# Patient Record
Sex: Female | Born: 1965 | Race: White | Hispanic: No | Marital: Married | Smoking: Never smoker
Health system: Southern US, Community
[De-identification: ages and names within clinical notes are randomized; demographics above are authoritative.]

---

## 1999-03-22 ENCOUNTER — Other Ambulatory Visit: Admission: RE | Admit: 1999-03-22 | Discharge: 1999-03-22 | Payer: Self-pay | Admitting: Obstetrics and Gynecology

## 1999-10-06 ENCOUNTER — Inpatient Hospital Stay (HOSPITAL_COMMUNITY): Admission: AD | Admit: 1999-10-06 | Discharge: 1999-10-09 | Payer: Self-pay | Admitting: *Deleted

## 1999-12-02 ENCOUNTER — Other Ambulatory Visit: Admission: RE | Admit: 1999-12-02 | Discharge: 1999-12-02 | Payer: Self-pay | Admitting: Obstetrics and Gynecology

## 2001-03-29 ENCOUNTER — Other Ambulatory Visit: Admission: RE | Admit: 2001-03-29 | Discharge: 2001-03-29 | Payer: Self-pay | Admitting: Obstetrics and Gynecology

## 2004-07-01 ENCOUNTER — Other Ambulatory Visit: Admission: RE | Admit: 2004-07-01 | Discharge: 2004-07-01 | Payer: Self-pay | Admitting: Obstetrics and Gynecology

## 2004-09-29 ENCOUNTER — Inpatient Hospital Stay (HOSPITAL_COMMUNITY): Admission: AD | Admit: 2004-09-29 | Discharge: 2004-11-11 | Payer: Self-pay | Admitting: Obstetrics and Gynecology

## 2004-09-30 ENCOUNTER — Ambulatory Visit: Payer: Self-pay | Admitting: Neonatology

## 2004-11-09 ENCOUNTER — Encounter (INDEPENDENT_AMBULATORY_CARE_PROVIDER_SITE_OTHER): Payer: Self-pay | Admitting: *Deleted

## 2004-12-16 ENCOUNTER — Other Ambulatory Visit: Admission: RE | Admit: 2004-12-16 | Discharge: 2004-12-16 | Payer: Self-pay | Admitting: Obstetrics and Gynecology

## 2005-10-01 IMAGING — US US OB LIMITED
1 series · 13 of 28 positions shown · non-contrast
Comparison: none

CLINICAL DATA: Monitoring.  Patient?s fetal kidneys have not been identified and there is suspected renal agenesis present.

[Series 1: us ob limited · 0.31mm/px · 13 of 34 slices shown]
[im 2/34]
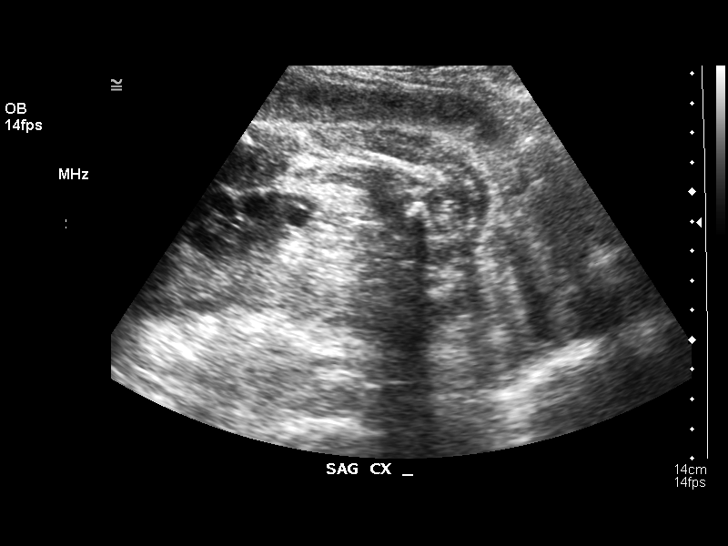
[im 4/34]
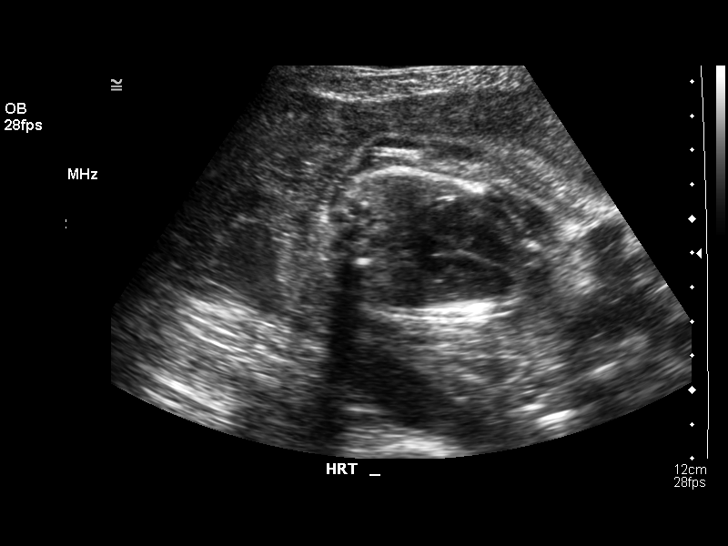
[im 7/34]
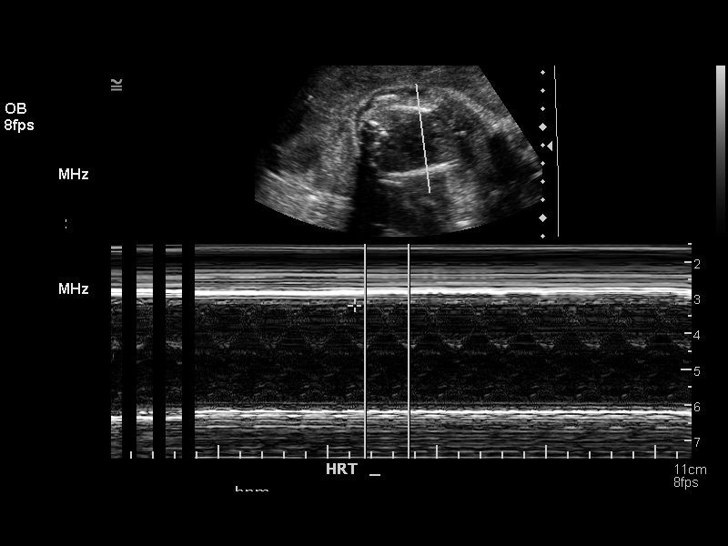
[im 9/34]
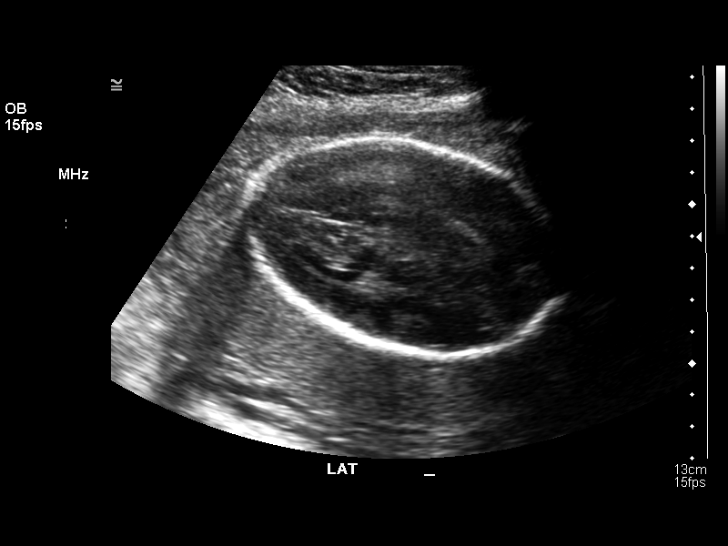
[im 12/34]
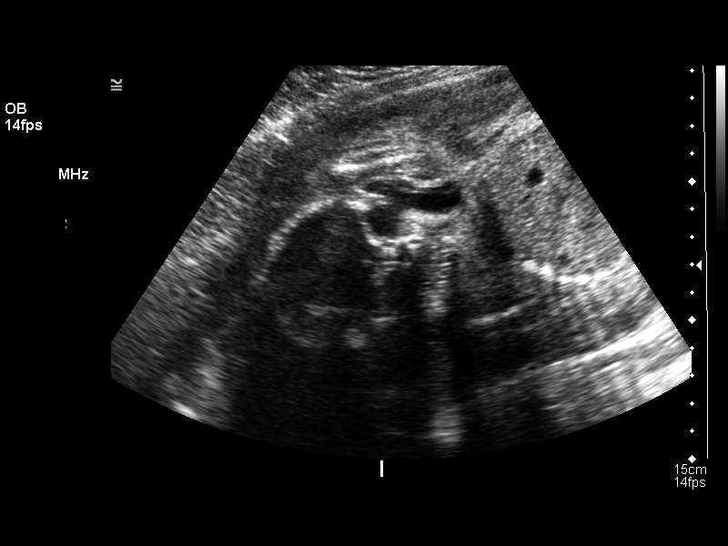
[im 14/34]
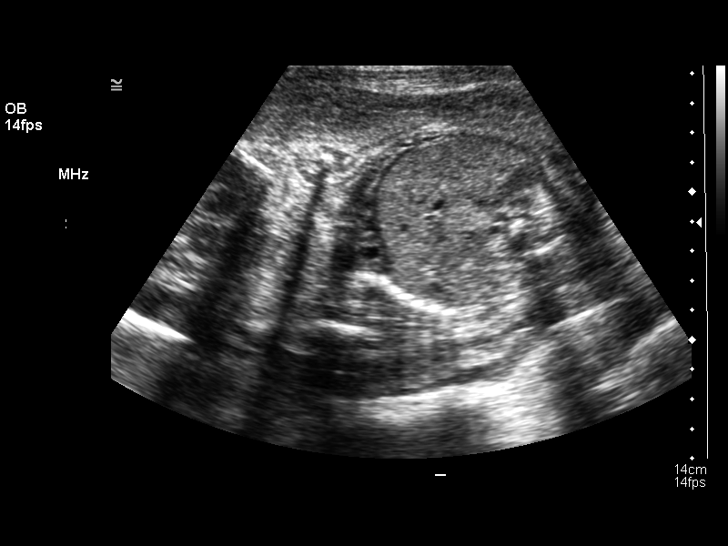
[im 18/34]
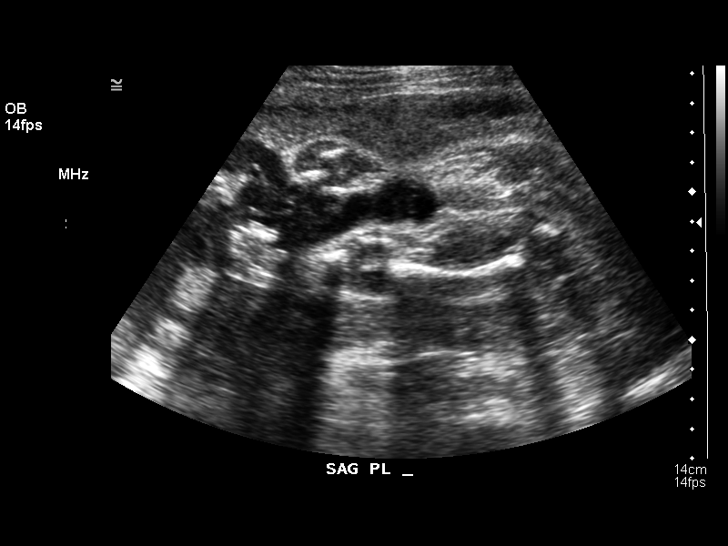
[im 20/34]
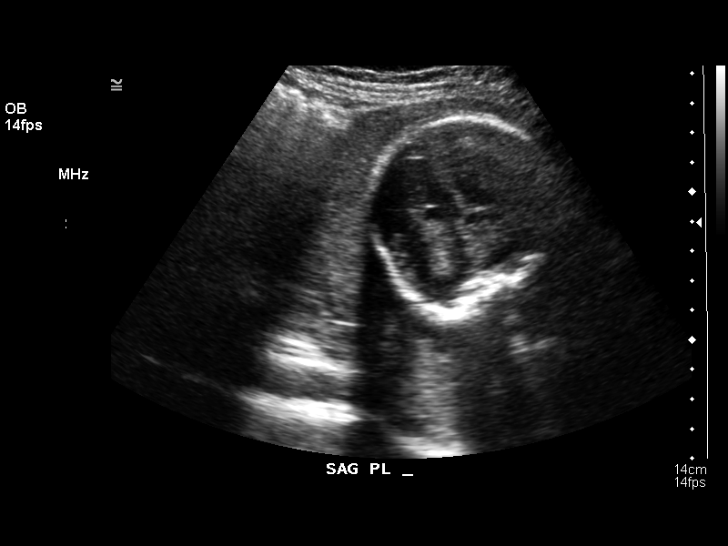
[im 23/34]
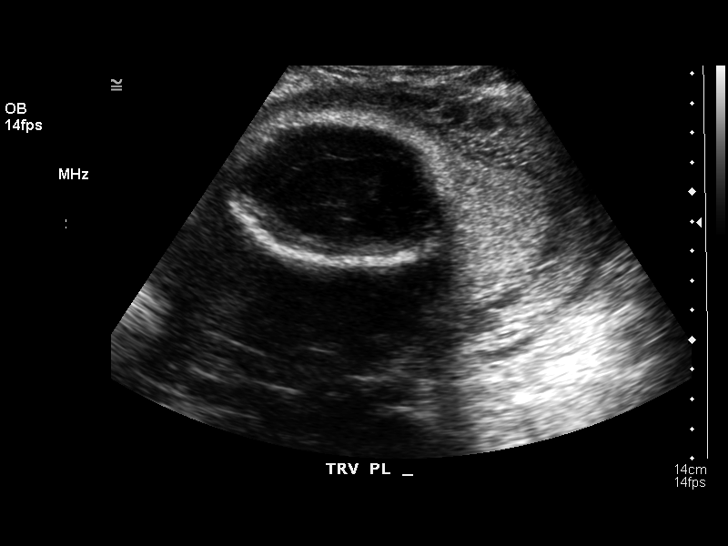
[im 25/34]
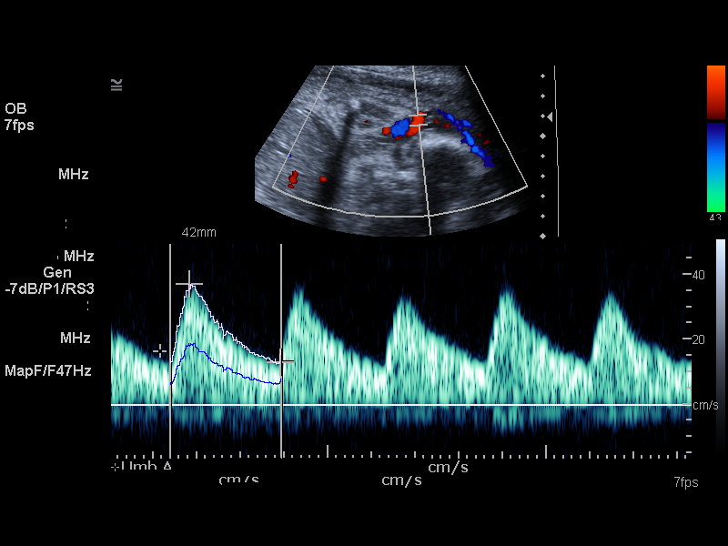
[im 27/34]
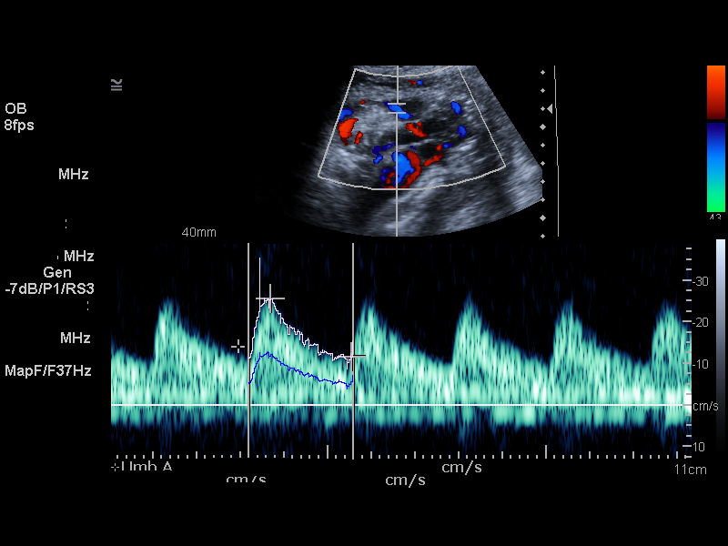
[im 30/34]
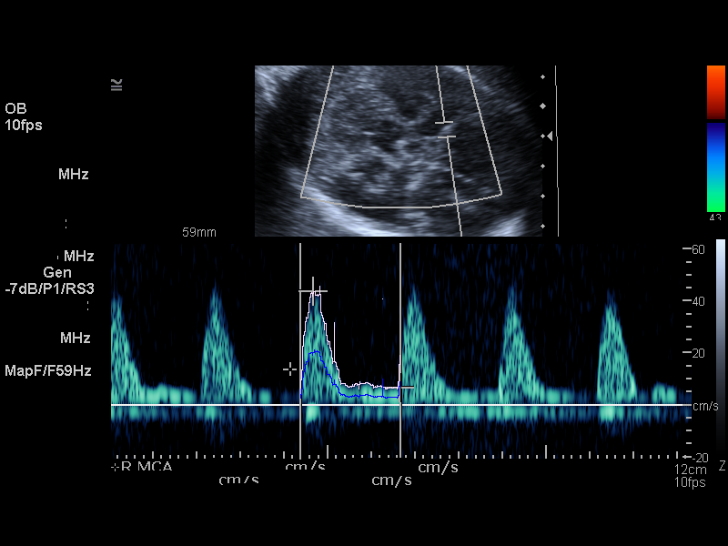
[im 32/34]
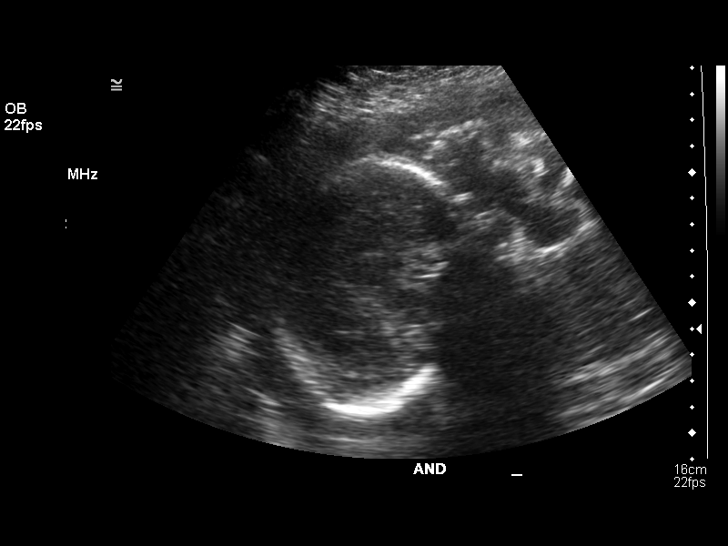

[13 of 28 positions shown; findings below may reference images not displayed]

LIMITED OBSTETRICAL ULTRASOUND:
Number of Fetuses:  1
Heart Rate:  150
Movement:  Yes
Breathing:  Yes
Presentation:  Frank breech
Placental Location:  Posterior
Grade:  II
Previa:  No
Amniotic Fluid (Subjective):  Decreased
Amniotic Fluid (Objective):  0.9 cm AFI (5th -95th%ile = 9.4 ? 22.8 cm for 28 wks)

Fetal measurements and complete anatomic evaluation were not requested.  The following fetal anatomy was visualized during this exam:  Lateral ventricles, thalami, posterior fossa and diaphragm.  
Comments:  Noted is the presence of a small pericardial effusion.  

MATERNAL UTERINE AND ADNEXAL FINDINGS
Cervix:  3.8 cm transabdominally

BIOPHYSICAL PROFILE

Movement:  2      Time:  30 minutes
Breathing:  2
Tone:  2
Amniotic Fluid:  0

Total Score:  6

DOPPLER ULTRASOUND OF FETUS:

Umbilical Artery S/D Ratio:  2.98    (NL< 4.55)

Middle Cerebral Artery PI:  2.43    (NL> 1.54)
IMPRESSION: 1.  Subjectively and quantitatively markedly decreased amniotic fluid volume with small vertical pocket measuring .9 cm noted.  
2.  Biophysical profile score [DATE].  The fetus scored zero for amniotic fluid volume as a 2 cm pocket of fluid was not noted. 
3.  Normal umbilical artery systolic-to-diastolic ratio and middle cerebral artery pulsatility index.  No absence or reversal of end diastolic flow was seen.  
4.  Normal cervical length.
5.  The anatomic evaluation is notable for no visualized kidneys or bladder compatible with the suspicion of renal agenesis.  Coronal views through the thorax reveal Kuuaronda Bakueleng chest and this finding is worrisome for the presence of associated pulmonary hypoplasia.  The lateral ventricles have a normal appearance, as does the posterior fossa and thalami.  A fluid-filled stomach cannot be identified and this may be due to lack of amniotic fluid to swallow.  Noted today is the presence of a small pericardial effusion with pericardial fluid thickness measurable at 4.6 mm.  No signs of pleural fluid or ascites is seen and no suggestion of soft tissue thickening is noted to sonographically suggest the presence of hydrops.  Close follow-up for this new finding is recommended.

## 2005-10-11 IMAGING — US US OB FOLLOW-UP
1 series · 13 of 28 positions shown · non-contrast
Comparison: none

CLINICAL DATA: Known renal agenesis.  Assess growth.

[Series 1: us ob follow-up · 13 of 39 slices shown]
[im 2/39]
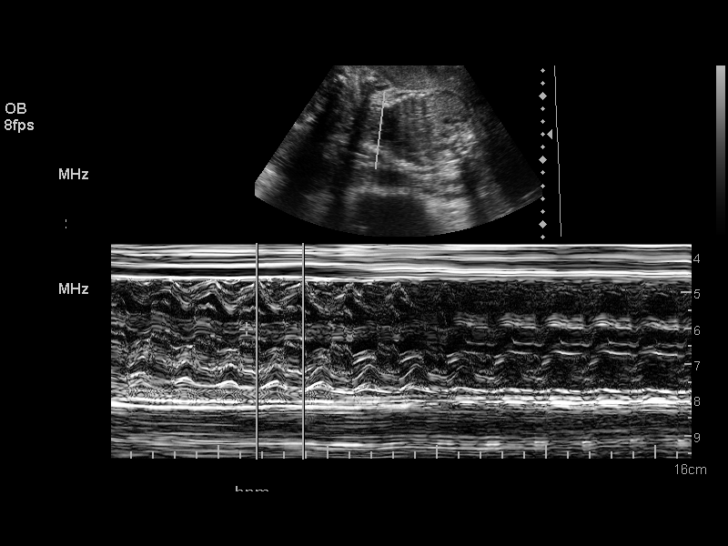
[im 5/39]
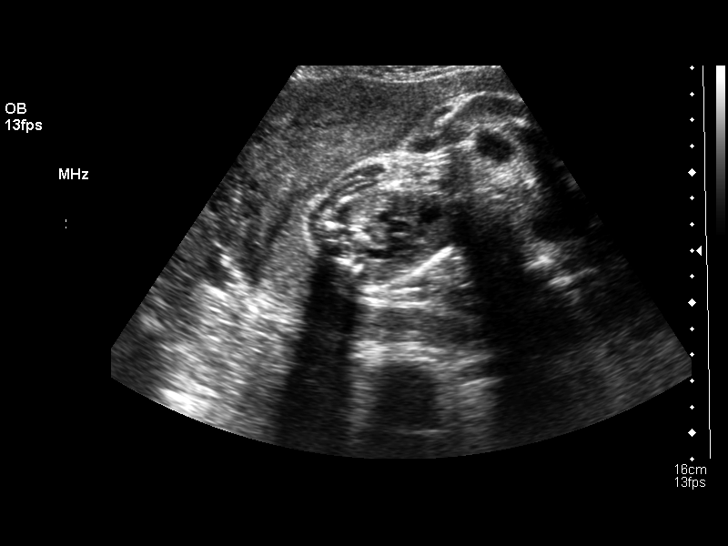
[im 8/39]
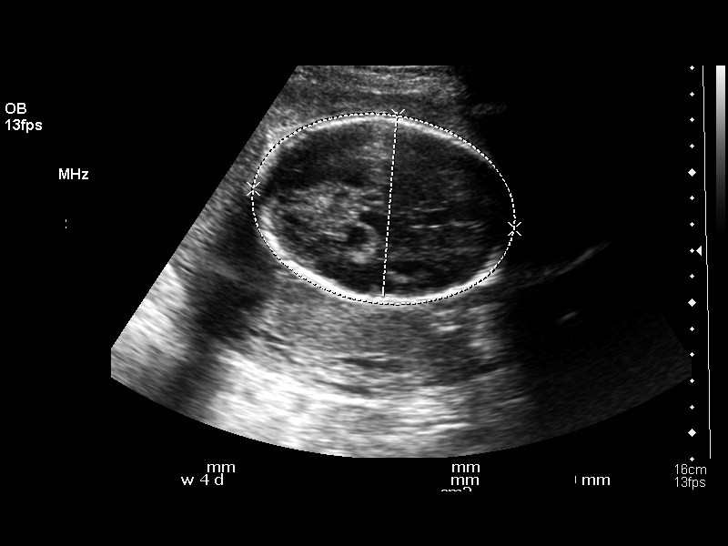
[im 10/39]
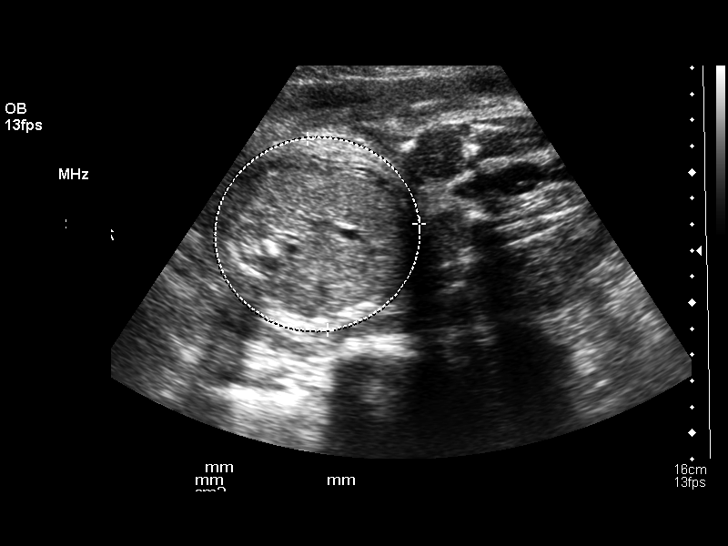
[im 13/39]
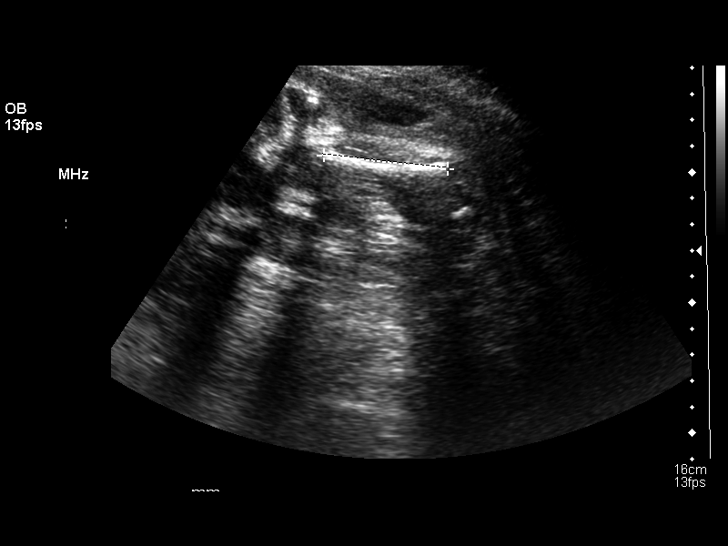
[im 16/39]
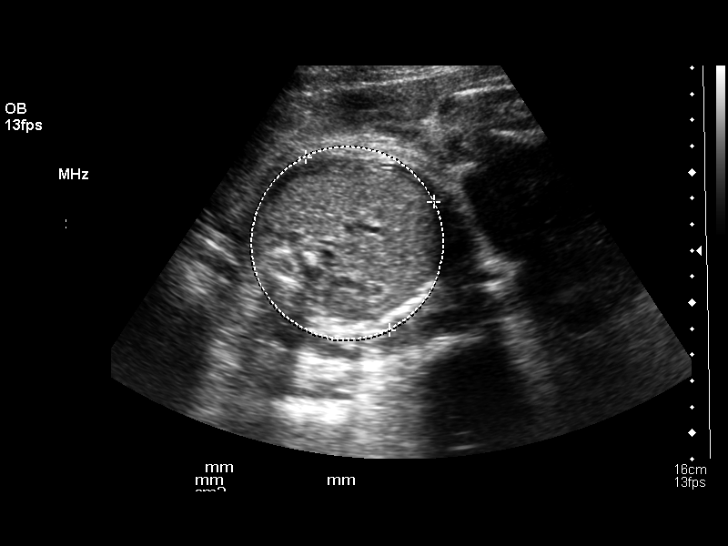
[im 20/39]
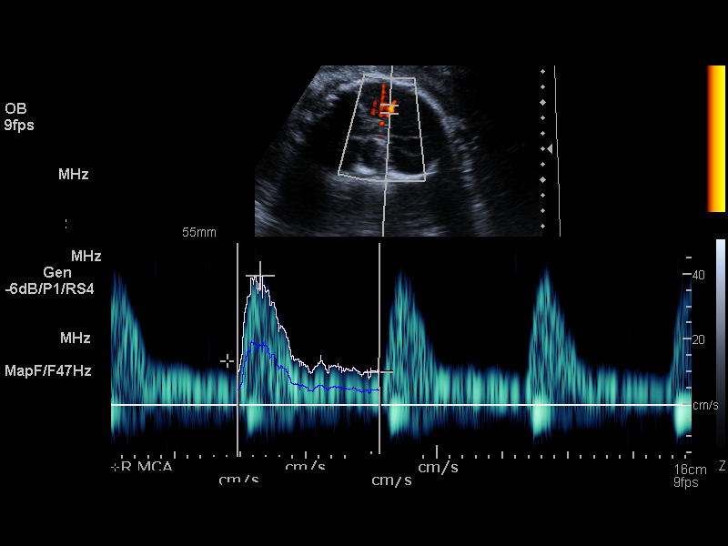
[im 23/39]
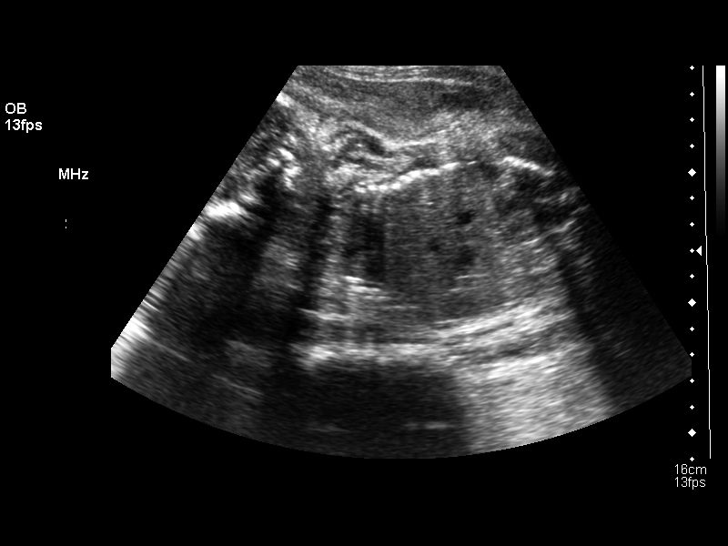
[im 26/39]
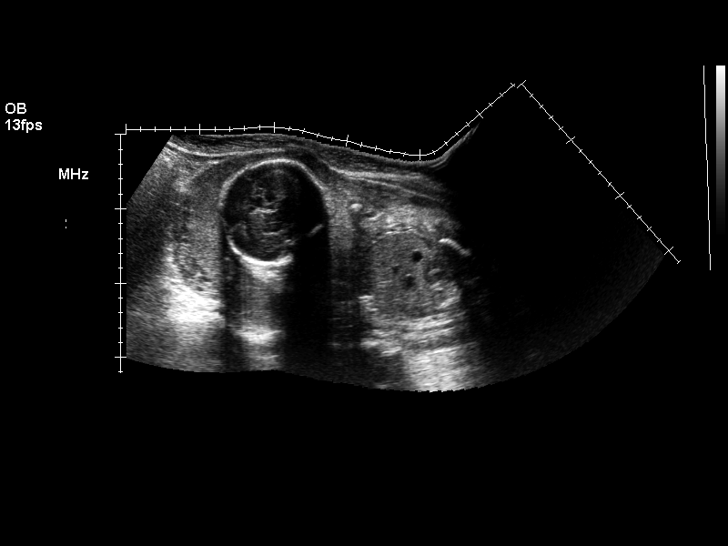
[im 29/39]
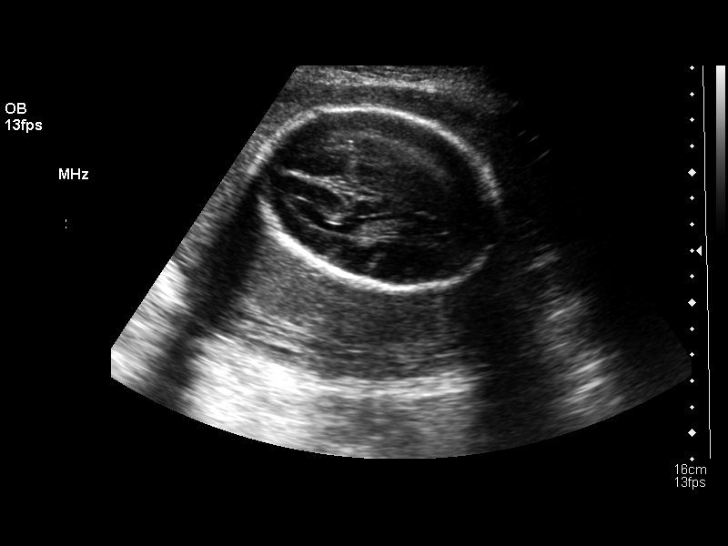
[im 31/39]
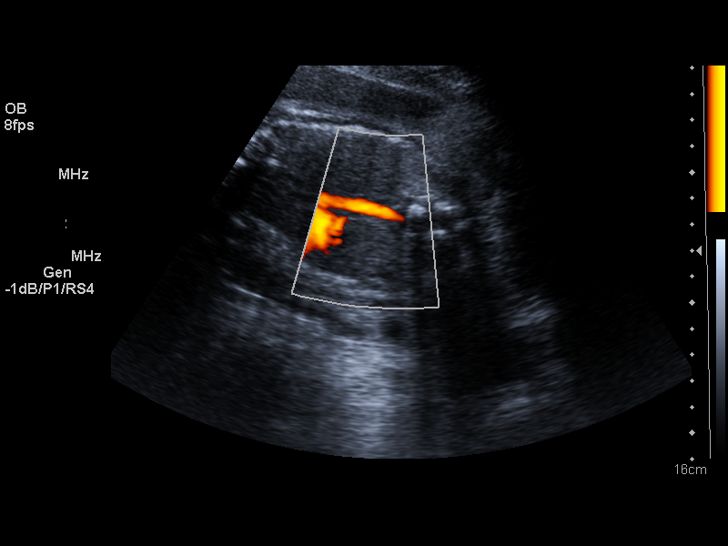
[im 34/39]
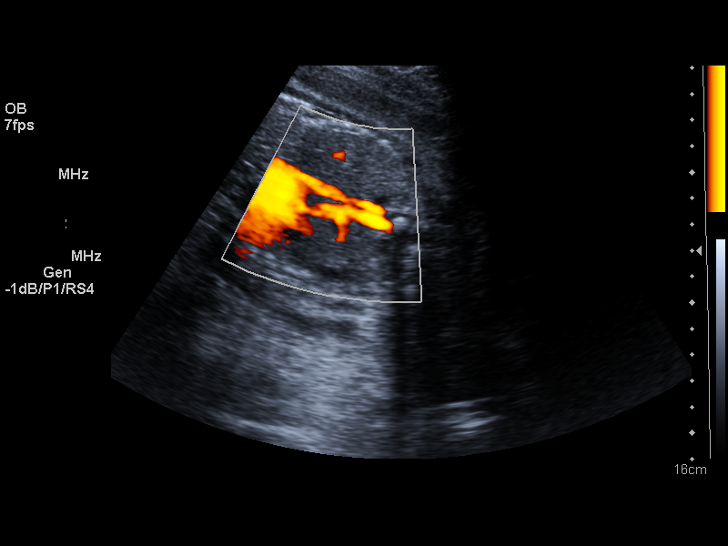
[im 37/39]
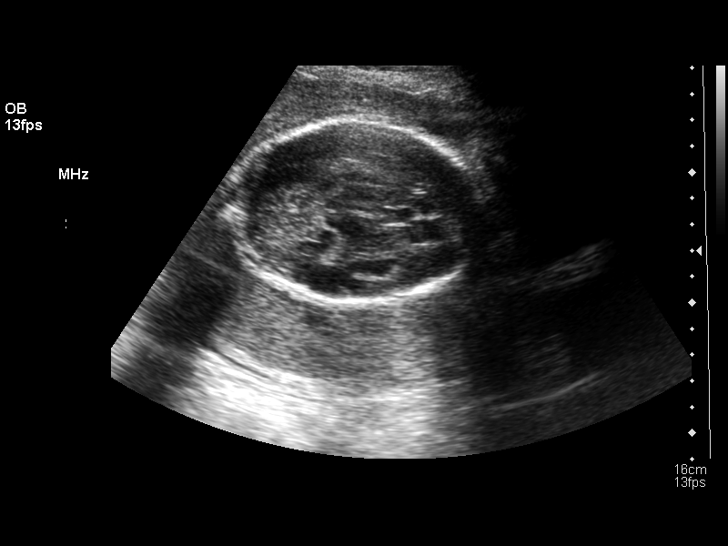

[13 of 28 positions shown; findings below may reference images not displayed]

OBSTETRICAL ULTRASOUND RE-EVALUATION:
Number of Fetuses: 1
Heart Rate:  141
Movement:  Yes
Breathing:  Yes
Presentation:  Breech
Placental Location:  Posterior
Grade:  II
Previa:  No
Amniotic Fluid (subjective):  Decreased
Amniotic Fluid (objective):  0.66 cm Vertical pocket 

FETAL BIOMETRY
BPD:  6.9 cm   27 w 5 d
HC:  26.4 cm  28 w 5 d
AC:  23.5 cm  27 w 6 d
FL:  4.9 cm  26 w 3 d

Mean GA:  27 w 5 d
Assigned GA:  29 w 2 d
Fetal indices are within normal limits.
EFW: 0710 g (H) 25th ? 50th%ile (1006 ? 0050 g) For 29 wks

FETAL ANATOMY
Lateral Ventricles:  Visualized 
Thalami/CSP:  Previously seen 
Posterior Fossa:  Previously seen 
Nuchal Region:  Previously seen 
Spine:  Not visualized 
4 Chamber Heart on Left:  Previously seen 
Stomach on Left:  Previously seen 
3 Vessel Cord:  Not visualized  
Cord Insertion Site:  Not visualized 
Kidneys:  Not visualized 
Bladder:  Not visualized 
Extremities:  Not visualized 

MATERNAL UTERINE AND ADNEXAL FINDINGS
Cervix:  Not evaluated.
DOPPLER ULTRASOUND OF FETUS:

Umbilical Artery S/D Ratio:  2.67    (NL< 4.35)

Middle Cerebral Artery PI: 1.71    (NL> 1.52)
IMPRESSION: 1.  Single intrauterine pregnancy demonstrating an estimated gestational age by ultrasound of 27 weeks and 5 days.  This is 1 week and 4 days behind assigned gestational age of by LMP of 29 weeks and 2 days.  Currently the estimated fetal weight is just above the 25th percentile for a   29 week gestation.  
2.  Subjectively and quantitatively complete absence of amniotic fluid volume with a single pocket measurement of .7 cm noted.  This would correlate with the history of renal agenesis.  Also noted is the presence of Gloriya Msk chest.
3.  Normal umbilical artery S/D ratio and middle cerebral artery Doppler assessment.

## 2007-11-06 ENCOUNTER — Ambulatory Visit (HOSPITAL_COMMUNITY): Admission: RE | Admit: 2007-11-06 | Discharge: 2007-11-06 | Payer: Self-pay | Admitting: Obstetrics and Gynecology

## 2008-03-13 ENCOUNTER — Inpatient Hospital Stay (HOSPITAL_COMMUNITY): Admission: RE | Admit: 2008-03-13 | Discharge: 2008-03-17 | Payer: Self-pay | Admitting: Obstetrics and Gynecology

## 2008-10-12 IMAGING — US US OB DETAIL+14 WK
1 series · 14 of 28 positions shown · non-contrast
Comparison: none

OBSTETRICAL ULTRASOUND:
 This ultrasound was performed in The [HOSPITAL], and the [facility] report will be stored to [REDACTED] PACS.

[Series 1: us ob detail+14 wk · 14 of 82 slices shown]
[im 4/82]
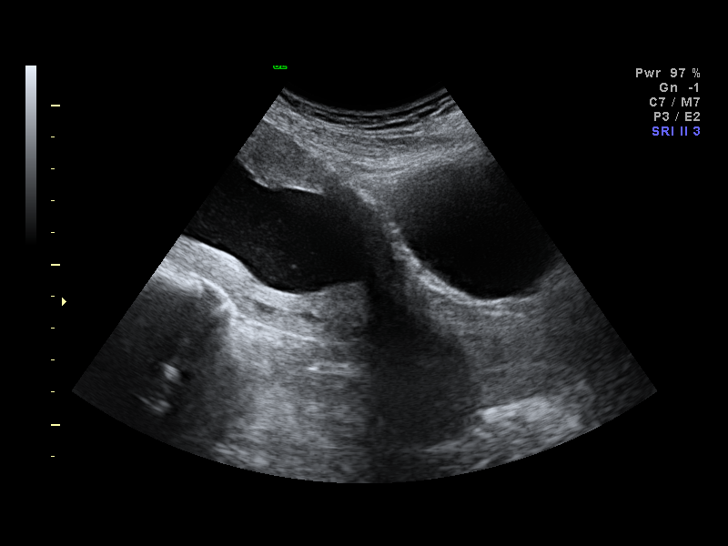
[im 10/82]
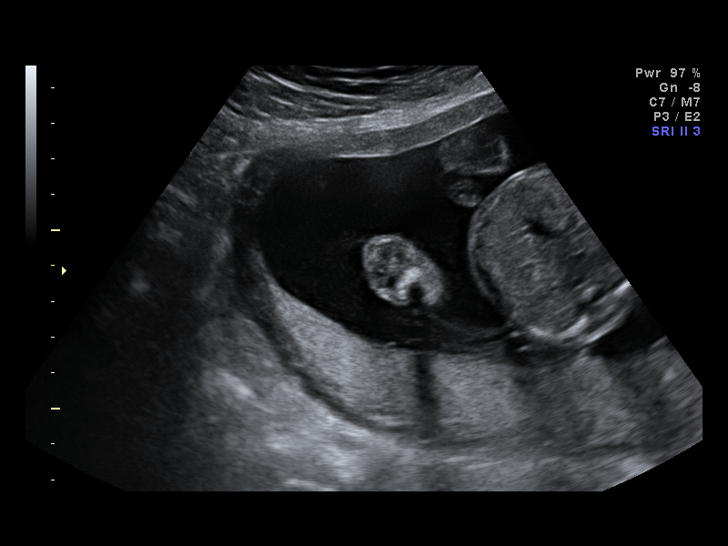
[im 16/82]
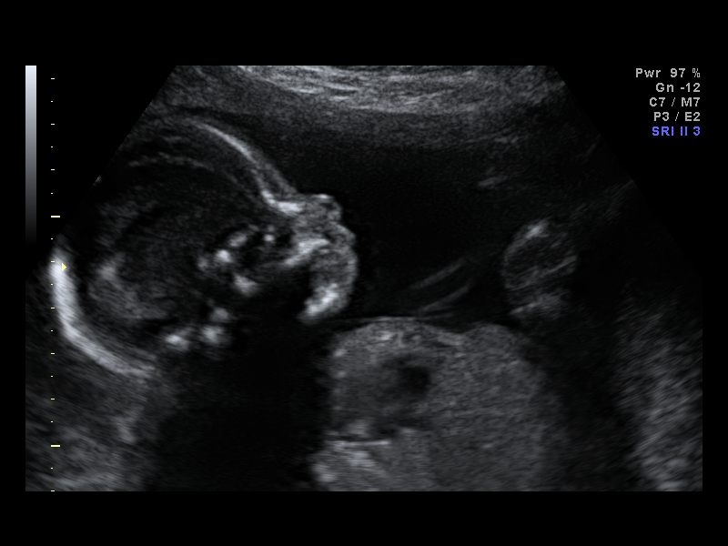
[im 22/82]
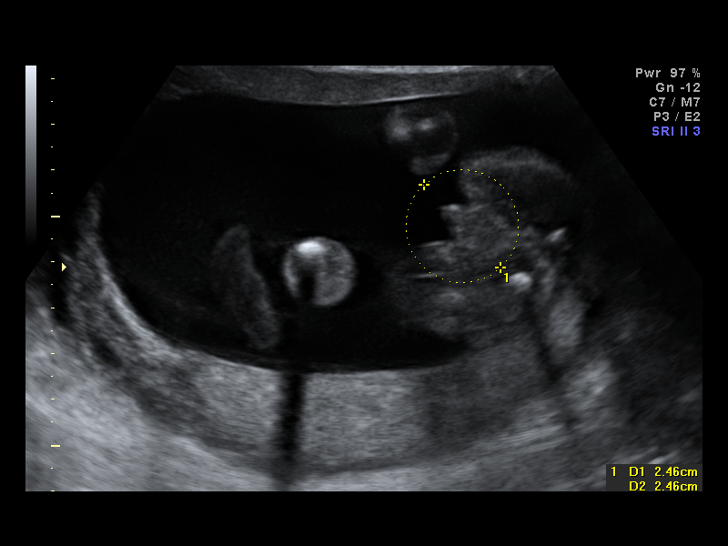
[im 28/82]
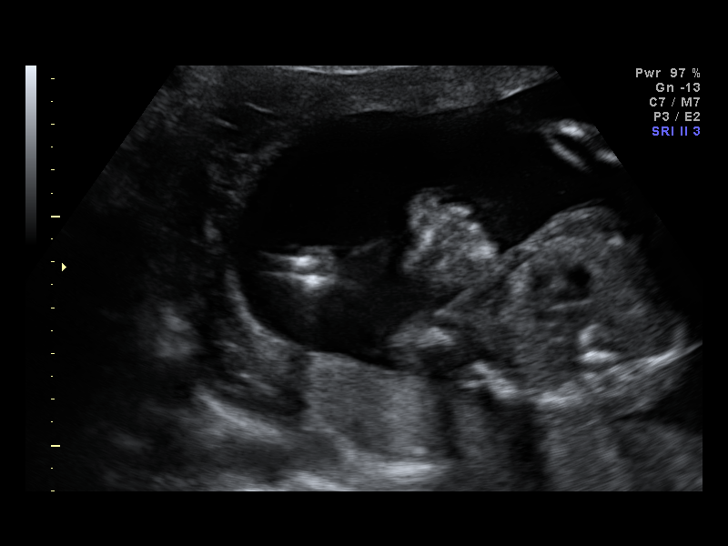
[im 34/82]
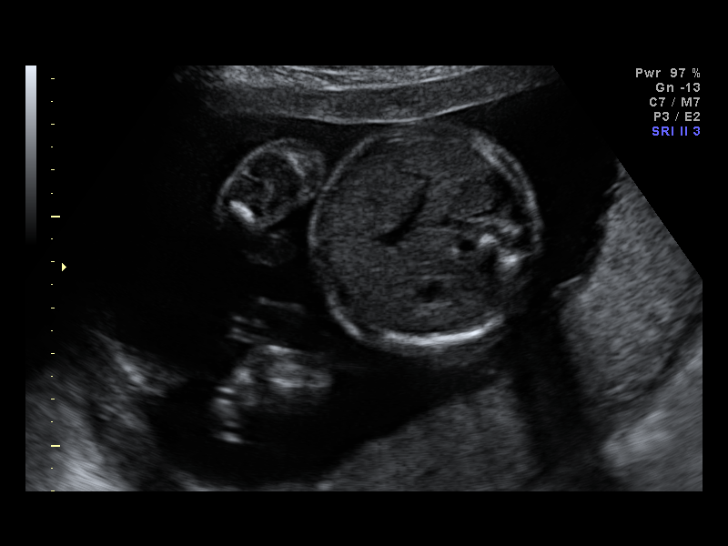
[im 40/82]
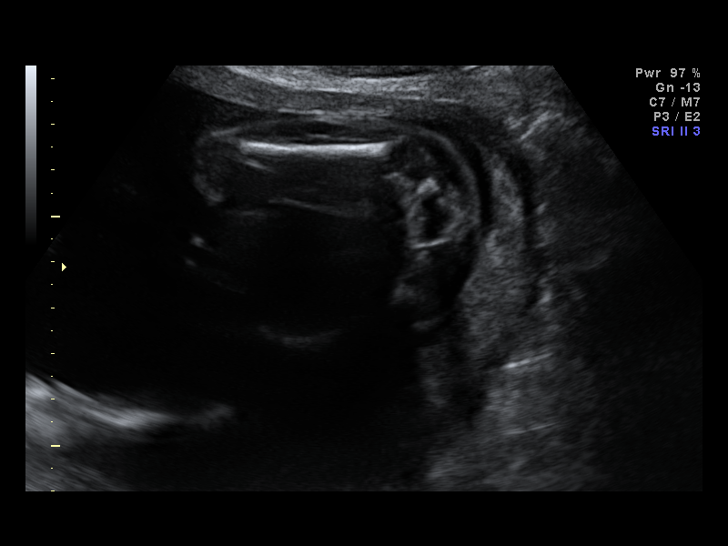
[im 46/82]
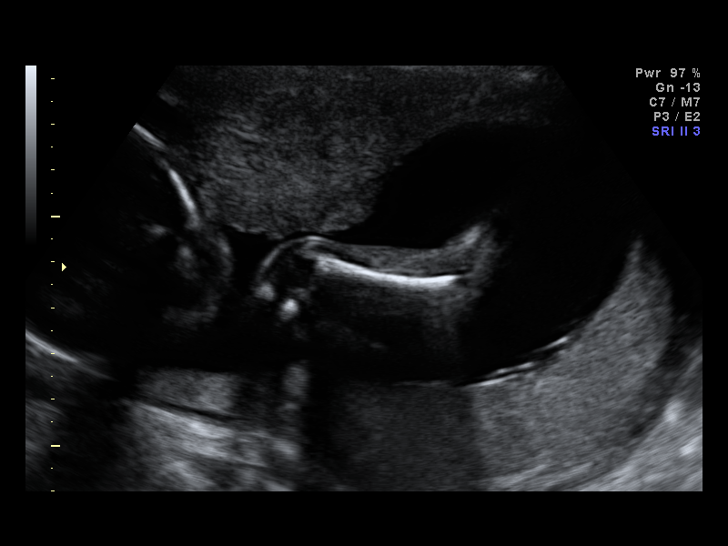
[im 52/82]
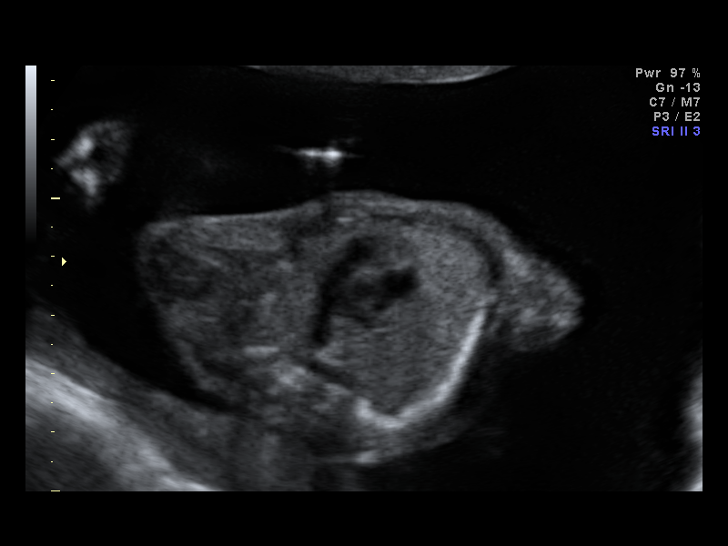
[im 58/82]
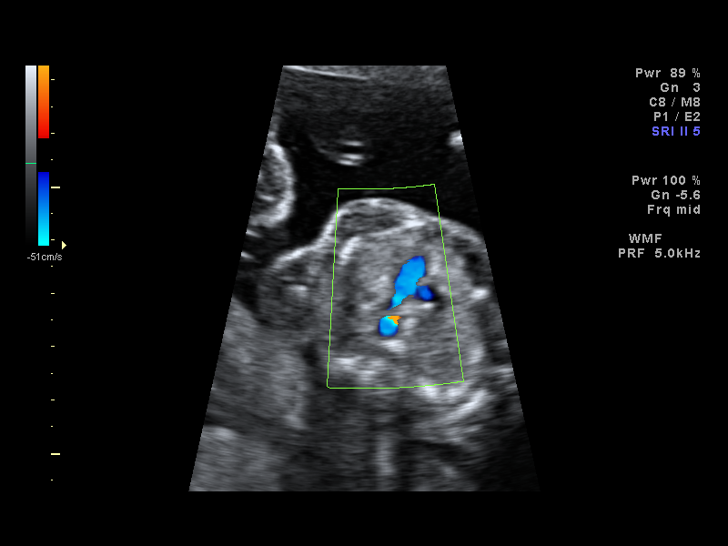
[im 64/82]
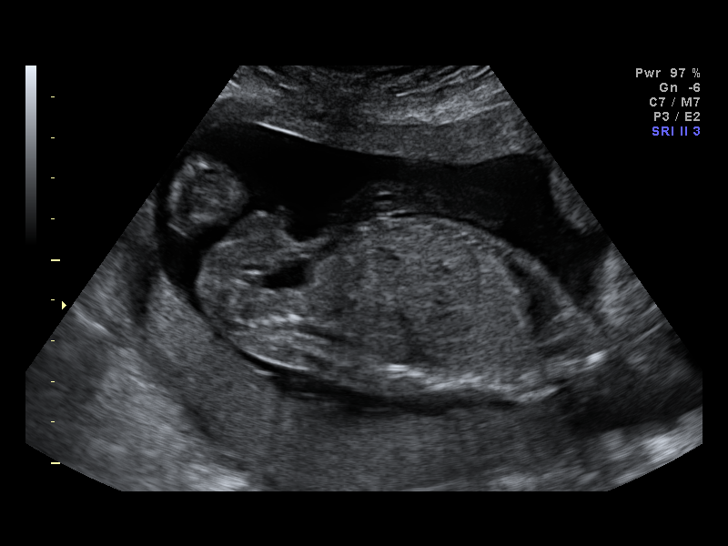
[im 70/82]
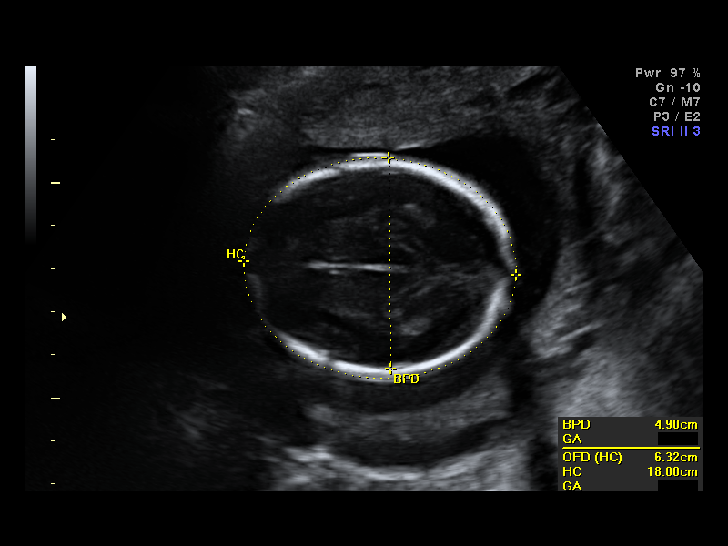
[im 76/82]
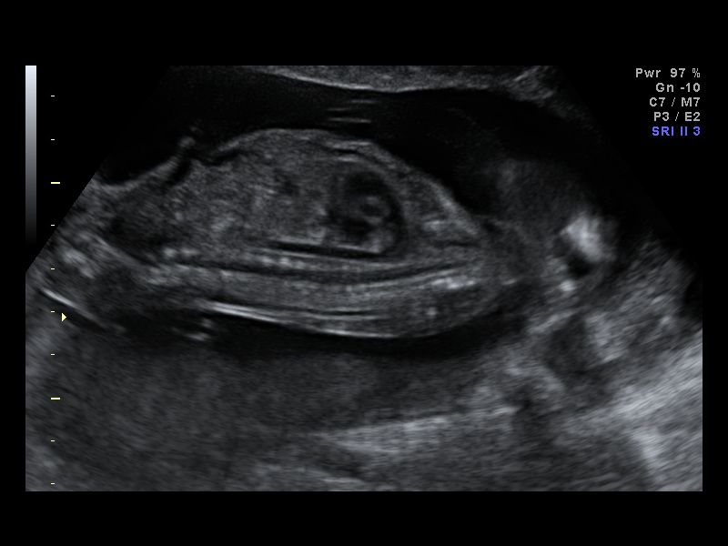
[im 82/82]
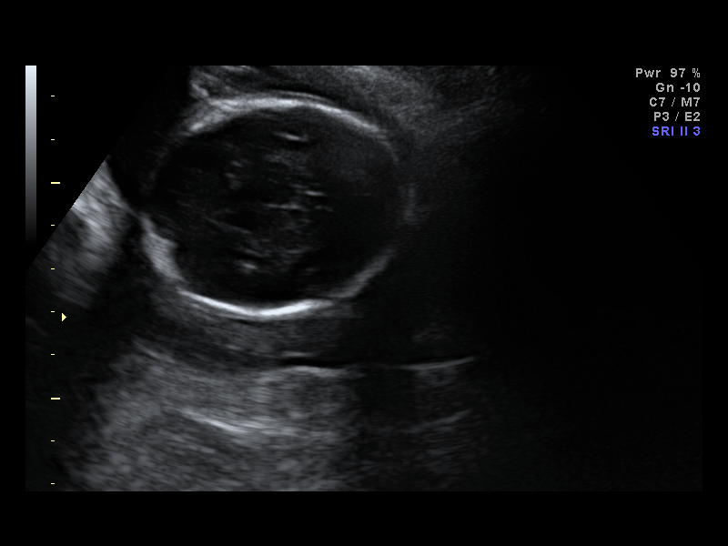

[14 of 28 positions shown; findings below may reference images not displayed]

IMPRESSION: [facility] has also been faxed to the ordering physician.

## 2010-03-21 ENCOUNTER — Encounter: Payer: Self-pay | Admitting: Obstetrics and Gynecology

## 2010-06-14 LAB — CBC
HCT: 39.3 % (ref 36.0–46.0)
HCT: 29.9 % — ABNORMAL LOW (ref 36.0–46.0)
Hemoglobin: 13.4 g/dL (ref 12.0–15.0)
Hemoglobin: 9.8 g/dL — ABNORMAL LOW (ref 12.0–15.0)
MCHC: 33.9 g/dL (ref 30.0–36.0)
MCHC: 34.4 g/dL (ref 30.0–36.0)
MCV: 93.8 fL (ref 78.0–100.0)
MCV: 93.9 fL (ref 78.0–100.0)
Platelets: 126 10*3/uL — ABNORMAL LOW (ref 150–400)
Platelets: 95 10*3/uL — ABNORMAL LOW (ref 150–400)
RBC: 2.98 MIL/uL — ABNORMAL LOW (ref 3.87–5.11)
RDW: 13.2 % (ref 11.5–15.5)
WBC: 8.3 10*3/uL (ref 4.0–10.5)
WBC: 8.5 10*3/uL (ref 4.0–10.5)

## 2010-06-14 LAB — ABO/RH: ABO/RH(D): O POS

## 2010-06-14 LAB — TYPE AND SCREEN
ABO/RH(D): O POS
Antibody Screen: NEGATIVE

## 2010-07-13 NOTE — Op Note (Signed)
NAME:  Kathleen Rocha, Kathleen Rocha            ACCOUNT NO.:  [id number]   MEDICAL RECORD NO.:  [id number]          PATIENT TYPE:  INP   LOCATION:  9104                          FACILITY:  [facility]   PHYSICIAN:  [facility]. Marcelle Overlie, M.D.DATE OF BIRTH:  Jan 03, 1966   DATE OF PROCEDURE:  03/13/2008  DATE OF DISCHARGE:                               OPERATIVE REPORT   PREOPERATIVE DIAGNOSIS:  Term intrauterine pregnancy, repeat cesarean  section, bilateral tubal ligation.   POSTOPERATIVE DIAGNOSES:  Term intrauterine pregnancy, repeat cesarean  section, bilateral tubal ligation.   PROCEDURE:  Repeat cesarean section and Filshie clip tubal ligation.   SURGEON:  [facility] Sal[facility]. Marcelle Overlie, MD   ANESTHESIA:  Spinal.   COMPLICATIONS:  None.   DRAINS:  Foley catheter.   BLOOD LOSS:  700 mL.   PROCEDURE AND FINDINGS:  The patient was taken to the operating room.  After an adequate level of spinal anesthetic was obtained with the  patient's legs supine, the abdomen prepped and draped in usual manner  for sterile abdominal procedures.  Foley catheter positioned draining  clear urine.  Transverse incision made through the old scar, which was  well healed, carried down to the fascia which was incised and extended  transversely.  Rectus muscle divided in the midline.  Peritoneum entered  superiorly without incident and extended vertically.  The vesicouterine  serosa was then incised and the bladder was bluntly and sharply  dissected below.  There were some significant bladder flap adhesions,  which were lysed in an avascular plane.  Once the bladder was dissected  below, the bladder blade was repositioned.  Transverse incision made in  the lower segment, extended with blunt dissection.  Clear fluid noted.  The patient delivered of a 6 pounds 2 ounces female Apgars 9 and 9.  The  infant was suctioned, cord clamped, passed to pediatric team for further  care.  Placenta was delivered manually intact, uterus  exteriorized,  cavity wiped clean with laparotomy pack.  Closure obtained with first  layer with 0 chromic in a locked fashion followed by an imbricating  layer of 0 chromic.  This was hemostatic.  Bilateral tubes and ovaries  were normal.  The bladder flap area was intact and hemostatic on  inspection.  A Filshie clip was applied at right angle 2-3 cm from the  cornu on each side with excellent application.  After this was  completed, uterus was returned its intra-abdominal site.  Pelvis  irrigated, aspirated, the operative site inspected, noted to be  hemostatic.  Prior to closure, sponge, needle, and instrument count is  reported as correct x2.  Peritoneum closed with running 2-0 Vicryl  suture.  3-0 Vicryl interrupted  sutures were used to reapproximate the rectus muscles with 0 PDS from  lateral to midline on either side was used to close the fascia,  subcutaneous tissue was hemostatic.  Clips Steri-Strips used on the  skin.  She tolerated this well, went to recovery room in good condition.      Richard M. Marcelle Overlie, M.D.  Electronically Signed     RMH/MEDQ  D:  03/13/2008  T:  03/14/2008  Job:  [id number]

## 2010-07-13 NOTE — H&P (Signed)
NAME:  Kathleen Rocha, Kathleen Rocha            ACCOUNT NO.:  [id number]   MEDICAL RECORD NO.:  [id number]         PATIENT TYPE:  WINP   LOCATION:                                [facility]ACILITY[facility]   PHYSICIAN:  [facility]. Marcelle Overlie, M.D.    DATE OF BIRTH:   DATE OF ADMISSION:  03/13/2008  DATE OF DISCHARGE:                              HISTORY & PHYSICAL   CHIEF COMPLAINT:  For repeat cesarean section and tubal ligation.   HISTORY OF PRESENT ILLNESS:  A 45 year old G6, P 4-0-0-4, EDD March 18, 2008 presents for RCS after an uncomplicated pregnancy.  She  declined CS screening.  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  She has had serial ultrasounds with normal findings.  First trimester  screen, AFP only, GBS all negative.  One-hour GTT was normal.  She  presents now for RCS.  This procedure including risks related to  bleeding, infection, adjacent organ injury, the possible need for open  additional surgery, transfusion risks related to phlebitis, wound  infection all discussed with other permanence of the procedure, failure  rate of 1/500 reviewed with her which she understands and accepts.   PAST MEDICAL HISTORY:   ALLERGIES:  PENICILLIN.   OPERATION:  Cesarean section.   For remainder of her medical history, please see the [facility] form.   PHYSICAL EXAMINATION:  VITAL SIGNS:  Temp 98.2, blood pressure 120/70.  HEENT: Unremarkable.  NECK:  Supple without masses.  LUNGS:  Clear.  CARDIOVASCULAR:  Regular, rate, and rhythm without murmurs, rubs, or  gallops.  BREASTS:  Without masses.  Term fundal height.  Fetal heart rate 140.  Cervix was closed.  EXTREMITIES:  Unremarkable.  NEUROLOGIC:  Unremarkable.   IMPRESSION:  Term intrauterine pregnancy, previous cesarean section.   PLAN:  Repeat cesarean section at term, tubal ligation procedure and  risks reviewed as above.      Richard M. Marcelle Overlie, M.D.  Electronically Signed     RMH/MEDQ  D:  03/11/2008  T:  03/12/2008   Job:  [id number]

## 2010-07-16 NOTE — Op Note (Signed)
NAME:  Kathleen Rocha, SKILTON            ACCOUNT NO.:  [id number]   MEDICAL RECORD NO.:  [id number]          PATIENT TYPE:  INP   LOCATION:  9199                          FACILITY:  [facility]   PHYSICIAN:  Juluis Mire, M.D.   DATE OF BIRTH:  March 13, 1965   DATE OF PROCEDURE:  11/09/2004  DATE OF DISCHARGE:                                 OPERATIVE REPORT   PREOPERATIVE DIAGNOSES:  1.  Intrauterine pregnancy at 30 weeks with severe oligohydramnios.  2.  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  3.  Breech presentation.   POSTOPERATIVE DIAGNOSES:  1.  Intrauterine pregnancy at 30 weeks with severe oligohydramnios.  2.  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  3.  Breech presentation.   PROCEDURE:  Low vertical cesarean section.   ANESTHESIA:  Spinal.   ESTIMATED BLOOD LOSS:  500 to 600 mL.   PACKS AND DRAINS:  None.   INTRAOPERATIVE BLOOD REPLACEMENT:  None.   COMPLICATIONS:  None.   INDICATION:  The patient is a 45 year old gravida 5, para 4 married white  female with an estimated gestational age of approximately 30 weeks.  Her  prenatal course has been complicated by the development of oligohydramnios  early in the pregnancy.  Evaluation by [facility] as well as [facility]  perinatologists has brought up the distinct possibility of renal agenesis  leading to this decreased fluid.  She did understand the potential  complications of renal agenesis with deformities from contraction as well as  hypoplastic lungs and lack of viability despite management.  The patient  decided that she wanted to continue with the pregnancy and be as aggressive  as possible.  She was admitted at 24 weeks, placed on continuous monitoring,  and followed with serial ultrasounds.  We never really accumulated any  significant fluid.  The fetal heart rate would have episodes of bradycardia,  lasting 4 to 5 minutes.  As we are now at 30 weeks with significant  oligohydramnios and the episodes of bradycardia, the decision is to proceed  with  primary cesarean section to prevent any further compromise of the  infant from cord compression.  We have discussed again the risk of  prematurity as well as the risk of hypoplastic lungs.  The risks of cesarean  section were discussed including the risk of infection, the risk of  hemorrhage (which could require a transfusion), the risk of AIDS or  hepatitis, risk of injury to the adjacent organs requiring further  exploratory surgery, risk of deep vein thrombosis and pulmonary embolus.  We  have discussed with the patient other options including MRI to determine the  kidneys.  If renal agenesis was confirmed, we could be less aggressive with  delivery in terms of not proceeding with cesarean section.  The patient is  adamant about proceeding with cesarean section at this point.   PROCEDURE:  The patient was taken to the OR and placed in the supine  position with left lateral tilt.  After satisfactory level of spinal  anesthesia was obtained, the abdomen was prepped out with Betadine and  draped in a sterile field.  A low transverse skin incision was  made with the  knife and carried to the subcutaneous tissue.  The anterior rectus fascia  was entered sharply and the incision extended laterally.  The fascia was  taken off of the muscles superiorly and inferiorly.  The rectus muscles were  separated in the midline.  The peritoneum was entered and incision in the  peritoneum centered both superiorly and inferiorly.  A low transverse  bladder flap was developed.  There was not much lower uterine segment.  Therefore, we decided to do a vertical incision with the knife.  We extended  upward using the bandage scissors.  The infant presented in the breech  presentation.  Infant was delivered.  It was a viable female.  Apgars were 3,  3, and 7.  The pH is pending.  The infant was intubated and transferred to  the NICU.  He was pink and active at this point.  The placenta was then  delivered and sent  for pathologic review.  The uterus was exteriorized.  Tubes and ovaries were unremarkable.  The uterus was closed with  interlocking suture of 0 chromic.  We did approximately three layers to  bring the vertical incision together.  We had good hemostasis.  Urine output  remained clear and adequate.  We returned the uterus back to the abdominal  cavity.  We thoroughly irrigated the pelvis.  We had good hemostasis and  clear urine output.  Muscles were reapproximated with running suture of 3-0  Vicryl.  The fascia was closed with running suture of 0 PDS.  The skin was  closed with staples and Steri-Strips.  Sponge, instrument, and needle count  was reported as correct by the circulating nurse x2.  Foley catheter again  remained clear at the time of closure.  The patient tolerated the procedure  well and was returned to the recovery room in good condition.      Juluis Mire, M.D.  Electronically Signed     JSM/MEDQ  D:  11/09/2004  T:  11/09/2004  Job:  [id number]

## 2010-07-16 NOTE — Discharge Summary (Signed)
NAME:  Kathleen Rocha, Kathleen Rocha            ACCOUNT NO.:  [id number]   MEDICAL RECORD NO.:  [id number]          PATIENT TYPE:  INP   LOCATION:  9104                          [facility]ILITY[facility]   PHYSICIAN:  Zelphia Cairo, MD    DATE OF BIRTH:  12/08/1965   DATE OF ADMISSION:  03/13/2008  DATE OF DISCHARGE:  03/17/2008                               DISCHARGE SUMMARY   ADMITTING DIAGNOSES:  1. Intrauterine pregnancy at term.  2. Previous cesarean section, desires repeat.  3. Multiparity, desires tubal ligation.   DISCHARGE DIAGNOSES:  1. Status post low transverse cesarean section.  2. Viable female infant.   PROCEDURES:  1. Repeat low transverse cesarean section.  2. Bilateral tubal ligation.   REASON FOR ADMISSION:  Please see dictated H&P.   HOSPITAL COURSE:  The patient is a 45 year old gravida 6, para 4-0-0-4  that was admitted to [facility] for scheduled cesarean  section with an uncomplicated pregnancy.  Due to multiparity, the  patient has also requested permanent sterilization.  On the morning of  admission, the patient was taken to the operating room where spinal  anesthesia was administered without difficulty.  A low transverse  incision was made with the delivery of a viable female infant weighing 6  pounds 2 ounces with Apgars of 9 at 1 minute and 9 at 5 minutes.  The  patient tolerated the procedure well and was taken to the recovery room  in stable condition.  On postoperative day #1, the patient was without  complaint.  She denied headache or blurred vision.  Vital signs were  stable with blood pressure 87/52 to 96/56, deep tendon reflexes are 1+,  no clonus.  Abdomen soft with some decrease in bowel sounds.  Fundus was  firm and nontender.  Abdominal dressing was noted to be clean, dry, and  intact.  Foley had been discontinued.  However, the patient had not  voided at the time of roundings.  Laboratory findings revealed  hemoglobin of 9.8, platelet count of  95,000, and WBC count of 6.9.  Platelet count on admission had been 126.  Blood type was known to be  O+.  Motrin was held, PCA pump was discontinued, and CBC was ordered for  the following morning.  On postoperative day #2, the patient did  complain of some gas pains.  Vital signs were stable.  Abdomen was  moderately distended with positive bowel sounds.  Incision was clean,  dry, and intact.  Platelets were up to 109,000, hemoglobin was 10.3, and  Dulcolax suppository was given.  On postoperative day #3, the patient  did complain of some continued abdominal discomfort.  She had had some  results from the suppository.  Vital signs were stable.  Bowel sounds  were noted to be somewhat hypoactive.  Incision was clean, dry, and  intact.  The patient was given some warm beverages.  Dulcolax  suppository was repeated.  On the following morning, the patient denied  any nausea.  She had had some results from the suppository.  Vital signs  were stable.  Abdomen soft, continued to have some abdominal  distention.  Bowel sounds were continued to be somewhat hypoactive.  Fundus is firm  and nontender.  Incision was clean, dry, and intact.  Decision was made  to advance diet, increase warm beverages, and increase ambulation.  Later that afternoon, the patient was feeling more comfortable and  discharge instructions were reviewed, and the patient was later  discharged to home.   CONDITION ON DISCHARGE:  Stable.   DIET:  Regular as tolerated.   ACTIVITY:  No heavy lifting, no driving x2 weeks, and no vaginal entry.   FOLLOW UP:  The patient to follow up in the office in 1 week for an  incision check.  She is to call for temperature greater than 100  degrees, persistent nausea or vomiting, heavy vaginal bleeding and/or  redness or drainage from the incisional site.   DISCHARGE MEDICATIONS:  1. Tylox #30 1 p.o. every 4-6 hours p.r.n.  2. Motrin 600 mg every 6 hours.  3. Prenatal vitamins 1 p.o.  daily.  4. Colace 1 p.o. daily.      Julio Sicks, N.P.      Zelphia Cairo, MD  Electronically Signed    CC/MEDQ  D:  04/09/2008  T:  04/09/2008  Job:  [id number]

## 2010-07-16 NOTE — H&P (Signed)
NAME:  Kathleen Rocha, Kathleen Rocha            ACCOUNT NO.:  [id number]   MEDICAL RECORD NO.:  [id number]           PATIENT TYPE:   LOCATION:                                 FACILITY:   PHYSICIAN:  Juluis Mire, M.D.        DATE OF BIRTH:   DATE OF ADMISSION:  DATE OF DISCHARGE:                                HISTORY & PHYSICAL   HISTORY OF PRESENT ILLNESS:  The patient is a 45 year old gravida 5 para 4  married female with last menstrual period of February 13 giving her an  estimated date of confinement of January 18, 2005 and estimated gestational  age of [redacted] weeks. She is admitted for management of oligohydramnios.   In relation to the present admission, the patient has been watched with  advanced maternal age. She has declined any type of genetic evaluation. She  subsequently had an ultrasound done with the [facility] perinatologists. They  revealed marked oligohydramnios. They did not really see any fetal  anomalies. They thought kidneys were seen. Bladder was not ever visualized.  She was subsequently followed up here with an ultrasound at 19 weeks that  revealed amniotic fluid index of 2.5 cm. The fluid continued to decrease.  She had consultations both with the [employer] and the [place]  [facility] [place] [facility] perinatology department. They had discussed  the potential causes for the oligohydramnios including the possibility of  renal agenesis and the long-term issues with hypoplastic lungs. She has  decided to proceed with the pregnancy and wants to be as aggressive as  possible at the present time. Because she is 24 weeks with marked  oligohydramnios, we are going to admit her for continuous monitoring and  serial ultrasounds for assessment of the fetal status. She does understand  that we will need at probably some point to proceed with early delivery.  This could be in the form of a cesarean section.   ALLERGIES:  She is allergic to PENICILLIN.   MEDICATIONS:   Prenatal vitamins.   For past medical history, family history, and social history, please see  prenatal records.   REVIEW OF SYSTEMS:  Noncontributory.   PHYSICAL EXAMINATION:  VITAL SIGNS:  The patient is afebrile with stable  vital signs.  LUNGS:  Clear.  CARDIOVASCULAR:  Regular rhythm and rate. Grade 2/6 systolic ejection  murmur. No clicks or gallops.  ABDOMEN:  Gravid uterus consistent with dates.  PELVIC:  Deferred.  EXTREMITIES:  Trace edema.  NEUROLOGIC:  Grossly within normal limits. Deep tendon reflexes are 2+, no  clonus.   IMPRESSION:  Marked oligohydramnios, possibly secondary to renal agenesis.   PLAN:  Because of the patient's desire for aggressive management, will be  continuous monitoring and serial ultrasounds. Consider steroids if  appropriate. Plan delivery when there is any signs of fetal distress.      Juluis Mire, M.D.  Electronically Signed     JSM/MEDQ  D:  09/29/2004  T:  09/29/2004  Job:  [id number]

## 2010-07-16 NOTE — Discharge Summary (Signed)
NAME:  Kathleen Rocha, Kathleen Rocha            ACCOUNT NO.:  [id number]   MEDICAL RECORD NO.:  [id number]          PATIENT TYPE:  INP   LOCATION:  9310                          FACILITY:  [facility]   PHYSICIAN:  Dineen Kid. Rana Snare, M.D.    DATE OF BIRTH:  Feb 16, 1966   DATE OF ADMISSION:  09/29/2004  DATE OF DISCHARGE:  11/11/2004                                 DISCHARGE SUMMARY   ADMITTING DIAGNOSES:  1.  Intrauterine pregnancy at +24 weeks estimated gestational age.  2.  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  3.  Breech presentation.   DISCHARGE DIAGNOSES:  1.  Status post low vertical cesarean delivery.  2.  Viable female infant, expired.   PROCEDURE:  Primary low vertical cesarean delivery.   REASON FOR ADMISSION:  Please see dictated H&P.   HOSPITAL COURSE:  The patient was a 45 year old gravida 5 para 4 married  white female that was admitted to [facility] at 24 weeks  estimated gestational age for continuous monitoring and serial ultrasounds.  Prenatal course had been complicated by the development of oligohydramnios  early in pregnancy. The patient was evaluated by [facility] as well as [facility] and decision was made that baby had possible renal agenesis  leading to this decrease in amniotic fluid. The patient did decide she  wanted to continue with pregnancy and be as aggressive as possible. The  patient was now admitted at 24 weeks and was placed on continuous monitoring  and followed closely with serial ultrasounds. No significant amniotic fluid  developed. Fetal heart rate did have episodes of bradycardia lasting 4-5  minutes. At approximately 30 weeks, significant oligohydramnios continued  with severe episodes of bradycardia and decision was made to proceed with a  primary cesarean delivery to prevent further compromise of the infant from  cord compression. The patient was then taken to the operating room where  spinal anesthesia was  administered without difficulty. A low vertical  cesarean incision was made with the delivery of a viable female infant  weighing 2 pounds 10 ounces with Apgars of 3 at one minute, 7 at five  minutes, 7 at ten minutes. Arterial cord pH was 7.30. The baby was intubated  and taken to the NICU. The patient tolerated the procedure well and was  taken to the recovery room in stable condition. 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  The patient's vital signs were stable. She was afebrile. Abdomen was soft  with good return of bowel function. Abdominal dressing was noted to be  clean, dry and intact. Laboratory findings revealed hemoglobin of 10.1. On  the following morning, the baby did expire and the patient did desire early  discharge. Vital signs were stable. She was afebrile. Incision was clean,  dry and intact. She had good return of bowel function, ambulating well.  Discharge instructions were reviewed and the patient was discharged home.   CONDITION ON DISCHARGE:  Stable.   DIET:  Regular as tolerated.   ACTIVITY:  No heavy lifting, no driving x2 weeks, no vaginal  entry.   FOLLOW-UP:  The patient is to follow up in the office in 3-5 days for staple  removal. She is to call for temperature greater than 100 degrees, persistent  nausea and vomiting, heavy vaginal bleeding, and/or redness or drainage from  the incisional site.   DISCHARGE MEDICATIONS:  1.  Tylox #30 one p.o. q.4-6h. p.r.n.  2.  Motrin 600 mg q.6h.  3.  Colace one p.o. daily p.r.n.      Julio Sicks, N.P.      Dineen Kid Rana Snare, M.D.  Electronically Signed    CC/MEDQ  D:  12/15/2004  T:  12/15/2004  Job:  [id number]

## 2010-07-16 NOTE — Op Note (Signed)
[facility]  Patient:    Kathleen Rocha, Kathleen Rocha                     MRN: [id number] Proc. Date: 10/07/99 Adm. Date:  [id number] Attending:  Donne Hazel                           Operative Report  SURGEON:                      RAlan Mulder, M.D.  DESCRIPTION OF PROCEDURE:     [name] delivered vaginally at [phone] a viable female infant weighing 6 pounds 11 ounces.  This is the patients fourth delivery.  The patient pushed x 1-1/2 hours with minimal results.  The babys vertex was +3/+5.  The patient pushed with descensus to the introitus and crowing somewhat.  After an informed consent, a vacuum extraction-assisted delivery was undertaken.  The bladder was emptied.  A Mityvac M cup vacuum extraction device was used.  With the patient pushing, the baby was delivered easily and promptly at 0610, with one pull of the Mityvac device.  The vertex was asynclitism, but delivery was easy, without complications.  After the delivery the baby did well with Apgars of 8 and 9.  A small second-degree tear was noted and repaired in the usual fashion with a running #3-0 chromic suture.  Epidural anesthesia was present.  The placenta was delivered intact without difficulty with a three-vessel cord.  The patient did have epidural anesthetic.  The estimated blood loss was less than 500 cc.  There were no perioperative complications with this vacuum extraction device and assisted delivery.DD:  10/08/99 TD:  10/09/99 Job: [id number]

## 2015-11-18 ENCOUNTER — Encounter: Payer: Self-pay | Admitting: Emergency Medicine

## 2015-11-18 ENCOUNTER — Emergency Department (HOSPITAL_COMMUNITY)
Admission: EM | Admit: 2015-11-18 | Discharge: 2015-11-18 | Disposition: A | Discharge status: Home / Self Care | Payer: Self-pay | Attending: Emergency Medicine | Admitting: Emergency Medicine

## 2015-11-18 DIAGNOSIS — Z79899 Other long term (current) drug therapy: Secondary | ICD-10-CM | POA: Insufficient documentation

## 2015-11-18 DIAGNOSIS — R1031 Right lower quadrant pain: Secondary | ICD-10-CM | POA: Insufficient documentation

## 2015-11-18 LAB — COMPREHENSIVE METABOLIC PANEL
ALT: 15 U/L (ref 14–54)
ANION GAP: 6 (ref 5–15)
AST: 14 U/L — ABNORMAL LOW (ref 15–41)
Albumin: 4.2 g/dL (ref 3.5–5.0)
Alkaline Phosphatase: 49 U/L (ref 38–126)
BILIRUBIN TOTAL: 0.5 mg/dL (ref 0.3–1.2)
BUN: 9 mg/dL (ref 6–20)
CHLORIDE: 102 mmol/L (ref 101–111)
CO2: 28 mmol/L (ref 22–32)
Calcium: 9.1 mg/dL (ref 8.9–10.3)
Creatinine, Ser: 0.57 mg/dL (ref 0.44–1.00)
Glucose, Bld: 123 mg/dL — ABNORMAL HIGH (ref 65–99)
POTASSIUM: 3.9 mmol/L (ref 3.5–5.1)
Sodium: 136 mmol/L (ref 135–145)
TOTAL PROTEIN: 7.2 g/dL (ref 6.5–8.1)

## 2015-11-18 LAB — LIPASE, BLOOD: LIPASE: 25 U/L (ref 11–51)

## 2015-11-18 LAB — URINALYSIS, ROUTINE W REFLEX MICROSCOPIC
BILIRUBIN URINE: NEGATIVE
Glucose, UA: NEGATIVE mg/dL
Hgb urine dipstick: NEGATIVE
Ketones, ur: NEGATIVE mg/dL
LEUKOCYTES UA: NEGATIVE
NITRITE: NEGATIVE
PH: 6.5 (ref 5.0–8.0)
Protein, ur: NEGATIVE mg/dL
SPECIFIC GRAVITY, URINE: 1.012 (ref 1.005–1.030)

## 2015-11-18 LAB — CBC
HEMATOCRIT: 35.6 % — AB (ref 36.0–46.0)
HEMOGLOBIN: 12.6 g/dL (ref 12.0–15.0)
MCH: 31.9 pg (ref 26.0–34.0)
MCHC: 35.4 g/dL (ref 30.0–36.0)
MCV: 90.1 fL (ref 78.0–100.0)
Platelets: 230 10*3/uL (ref 150–400)
RBC: 3.95 MIL/uL (ref 3.87–5.11)
RDW: 12.5 % (ref 11.5–15.5)
WBC: 10 10*3/uL (ref 4.0–10.5)

## 2015-11-18 LAB — I-STAT BETA HCG BLOOD, ED (MC, WL, AP ONLY): I-stat hCG, quantitative: <5 m[IU]/mL (ref <5)

## 2015-11-18 NOTE — ED Provider Notes (Signed)
[facility]-EMERGENCY DEPT Provider Note   CSN: [id number] Arrival date & time: 11/18/15  1310     History   Chief Complaint Chief Complaint  Patient presents with  . Abdominal Pain    HPI Kathleen Rocha is a 50 y.o. female.  HPI   50 year old female brought here via EMS from home for evaluation of abdominal pain. Patient report acute onset of sharp crampy right lower quadrant abdominal pain that started approximately 3 hours ago while she was standing in the kitchen. Her pain lasted for approximately an hr a half and has since resolved. When the pain initially started, patient felt like labor contractions. Pain was intense, she felt nauseous, and she thought she was having gas pain. She went to use the bathroom but no improvement of symptoms. Since being in the ED, her pain has since resolved. She denies having fever, chills, chest pain, shortness of breath, back pain, dysuria, hematuria, vaginal bleeding, vaginal discharge. She denies any recent strenuous activities or heavy lifting. Her last menstrual period Was 2 days ago.   History reviewed. No pertinent past medical history.  There are no active problems to display for this patient.   History reviewed. No pertinent surgical history.  OB History    No data available       Home Medications    Prior to Admission medications   Not on File    Family History No family history on file.  Social History Social History  Substance Use Topics  . Smoking status: Never Smoker  . Smokeless tobacco: Never Used  . Alcohol use Yes     Allergies   Review of patient's allergies indicates not on file.   Review of Systems Review of Systems  All other systems reviewed and are negative.    Physical Exam Updated Vital Signs BP (!) 100/48 (BP Location: Right Arm)   Pulse 64   Temp 98.3 F (36.8 C) (Oral)   Resp 16   Ht 5\' 1"  (1.549 m)   Wt 56.7 kg   LMP 11/09/2015   SpO2 98%   BMI 23.62 kg/m   Physical Exam    Constitutional: She appears well-developed and well-nourished. No distress.  HENT:  Head: Atraumatic.  Eyes: Conjunctivae are normal.  Neck: Neck supple.  Cardiovascular: Normal rate and regular rhythm.   Pulmonary/Chest: Effort normal and breath sounds normal.  Abdominal: Soft. Bowel sounds are normal. She exhibits no distension. There is no tenderness.  No pain or abdominal exam. Negative Murphy sign, no pain at McBurney's point.  Neurological: She is alert.  Skin: No rash noted.  Psychiatric: She has a normal mood and affect.  Nursing note and vitals reviewed.    ED Treatments / Results  Labs (all labs ordered are listed, but only abnormal results are displayed) Labs Reviewed  COMPREHENSIVE METABOLIC PANEL - Abnormal; Notable for the following:       Result Value   Glucose, Bld 123 (*)    AST 14 (*)    All other components within normal limits  CBC - Abnormal; Notable for the following:    HCT 35.6 (*)    All other components within normal limits  LIPASE, BLOOD  URINALYSIS, ROUTINE W REFLEX MICROSCOPIC (NOT [facility])  I-STAT BETA HCG BLOOD, ED ([facility], [facility], [facility] ONLY)    EKG  EKG Interpretation None       Radiology No results found.  Procedures Procedures (including critical care time)  Medications Ordered in ED Medications - No data to  display   Initial Impression / Assessment and Plan / ED Course  I have reviewed the triage vital signs and the nursing notes.  Pertinent labs & imaging results that were available during my care of the patient were reviewed by me and considered in my medical decision making (see chart for details).  Clinical Course    BP 111/66 (BP Location: Right Arm)   Pulse 70   Temp 98.3 F (36.8 C) (Oral)   Resp 16   Ht 5\' 1"  (1.549 m)   Wt 56.7 kg   LMP 11/09/2015   SpO2 100%   BMI 23.62 kg/m    Final Clinical Impressions(s) / ED Diagnoses   Final diagnoses:  Right lower quadrant abdominal pain    New Prescriptions New  Prescriptions   No medications on file   3:41 PM Patient presents with pain to her right lower quadrant. Pain has since resolved. She has no pain on examination.  Her labs are reassuring.   4:19 PM Normal WBC, normal electrolyte Panel, pregnancy test is negative, urine shows no signs of urinary tract infection, normal lipase. Since patient did not have any reproducible lower abdominal pain, a pelvic examination was not performed today. I suspect she may have a ruptured ovarian cyst but also pain among many other possibility but at this time as no acute emergent medical condition warranting additional examination. Patient felt comfortable to be discharged. Return precaution discussed.   Fayrene Helper, PA-C 11/18/15 1621    Canary Brim Tegeler, MD 11/18/15 [id number]

## 2015-11-18 NOTE — ED Triage Notes (Addendum)
Per EMS, patient has acute onset of bilateral lower abdominal pain starting today. Reports nausea. Denies nausea or diarrhea. States her pain gets worse when she stands
